# Patient Record
Sex: Male | Born: 1965 | Race: White | Hispanic: No | Smoking: Former smoker
Health system: Southern US, Community
[De-identification: ages and names within clinical notes are randomized; demographics above are authoritative.]

## PROBLEM LIST (undated history)

## (undated) HISTORY — PX: KNEE SURGERY: SHX244

## (undated) HISTORY — PX: ANTERIOR CRUCIATE LIGAMENT REPAIR: SHX115

## (undated) HISTORY — PX: APPENDECTOMY: SHX54

---

## 2000-12-02 ENCOUNTER — Ambulatory Visit (HOSPITAL_COMMUNITY): Admission: RE | Admit: 2000-12-02 | Discharge: 2000-12-02 | Payer: Self-pay | Admitting: *Deleted

## 2000-12-02 ENCOUNTER — Encounter: Payer: Self-pay | Admitting: *Deleted

## 2005-06-02 ENCOUNTER — Emergency Department (HOSPITAL_COMMUNITY): Admission: EM | Admit: 2005-06-02 | Discharge: 2005-06-02 | Payer: Self-pay | Admitting: Emergency Medicine

## 2005-12-14 ENCOUNTER — Inpatient Hospital Stay (HOSPITAL_COMMUNITY): Admission: EM | Admit: 2005-12-14 | Discharge: 2005-12-15 | Payer: Self-pay | Admitting: Emergency Medicine

## 2010-10-11 ENCOUNTER — Observation Stay (HOSPITAL_COMMUNITY)
Admission: EM | Admit: 2010-10-11 | Discharge: 2010-10-11 | Disposition: A | Discharge status: Home / Self Care | Payer: Worker's Compensation | Attending: Orthopedic Surgery | Admitting: Orthopedic Surgery

## 2010-10-11 DIAGNOSIS — W298XXA Contact with other powered powered hand tools and household machinery, initial encounter: Secondary | ICD-10-CM | POA: Insufficient documentation

## 2010-10-11 DIAGNOSIS — S62609B Fracture of unspecified phalanx of unspecified finger, initial encounter for open fracture: Principal | ICD-10-CM | POA: Insufficient documentation

## 2010-10-11 DIAGNOSIS — Y998 Other external cause status: Secondary | ICD-10-CM | POA: Insufficient documentation

## 2010-10-11 LAB — DIFFERENTIAL
Basophils Relative: 0 % (ref 0–1)
Eosinophils Absolute: 0.1 10*3/uL (ref 0.0–0.7)
Neutrophils Relative %: 58 % (ref 43–77)

## 2010-10-11 LAB — PROTIME-INR
INR: 0.92 (ref 0.00–1.49)
Prothrombin Time: 12.6 seconds (ref 11.6–15.2)

## 2010-10-11 LAB — POCT I-STAT, CHEM 8
BUN: 17 mg/dL (ref 6–23)
Chloride: 105 mEq/L (ref 96–112)
HCT: 46 % (ref 39.0–52.0)
Sodium: 140 mEq/L (ref 135–145)
TCO2: 25 mmol/L (ref 0–100)

## 2010-10-11 LAB — CBC
Platelets: 214 10*3/uL (ref 150–400)
RBC: 4.79 MIL/uL (ref 4.22–5.81)
RDW: 12.5 % (ref 11.5–15.5)
WBC: 8.5 10*3/uL (ref 4.0–10.5)

## 2010-11-20 NOTE — Consult Note (Signed)
  NAME:  [name], [name] NO.:  [id number]  MEDICAL RECORD NO.:  [id number]           PATIENT TYPE:  I  LOCATION:  2550                         FACILITY:  [facility]  PHYSICIAN:  Artist Pais. Lanyah Spengler, M.D.DATE OF BIRTH:  [birth year]  DATE OF CONSULTATION:  10/11/2010 DATE OF DISCHARGE:  10/11/2010                                CONSULTATION   PHYSICIAN REQUESTING CONSULTATION:  April Palumbo-Rasch, MD  REASON FOR CONSULTATION:  Marvin Wilson is a 45 year old right-hand dominant male presents today transferred from [facility], [address] with a table saw injury to his nondominant left hand involving the long and ring fingers.  He is 45, has an allergy to CODEINE and BEE STINGS.  He is otherwise fairly healthy.  No significant other past medical or surgical history who has had an appendectomy in the past as well as an ACL reconstruction in the past.  He does not smoke or drink excessively.  No other medical issues, and no other medications on a regular basis.  Examination reveals complex injuries to the long and ring finger with nail plate fractures, loss of osseous integrity of the distal phalanges of the long and ring finger, and large complicated dorsal wound.  X-rays show open wounds with loss of the half of the distal phalanx in the long and ring finger.  IMPRESSION:  A 45 year old male with complex open injuries to his nondominant left long and ring fingers.  We had thorough and frank discussion regarding treatment options.  I would recommend to take him to the operating room for thorough irrigation and debridement of this grossly contaminated wound with repair as necessary.  They understand we will do this as soon as possible here at [facility].     Marvin Wilson, M.D.     MAW/MEDQ  D:  10/11/2010  T:  10/12/2010  Job:  [id number]  Electronically Signed by Dairl Ponder M.D. on 11/20/2010 11:24:02 AM

## 2010-11-20 NOTE — Op Note (Signed)
  NAME:  TYNER, CODNER NO.:  [id number]  MEDICAL RECORD NO.:  [id number]           PATIENT TYPE:  I  LOCATION:  2550                         FACILITY:  [facility]  PHYSICIAN:  Artist Pais. Deborahann Poteat, M.D.DATE OF BIRTH:  09-29-1965  DATE OF PROCEDURE:  10/11/2010 DATE OF DISCHARGE:  10/11/2010                              OPERATIVE REPORT   PREOPERATIVE DIAGNOSIS:  Complex left long, left ring finger, open wound distally.  POSTOPERATIVE DIAGNOSIS:  Complex left long, left ring finger, open wound distally.  PROCEDURE:  Irrigation and debridement above, open treatment distal phalangeal fractures and volar advancement flap, left long, left ring finger.  SURGEON:  Artist Pais. Mina Marble, MD  ASSISTANT:  None.  ANESTHESIA:  General.  TOURNIQUET TIME:  Twenty-six minutes.  COMPLICATIONS:  None.  DRAINS:  None.  The patient was taken to the operating suite after induction of adequate general anesthesia, left upper extremity was prepped and draped in sterile fashion.  Esmarch was used to exsanguinate the limb.  Tourniquet was then inflated to 150 mmHg at this point in time.  The left long and left ring finger were placed dorsally.  There was complex injury involving loss of the majority of the sterile matrix and part of the germinal matrix.  Loss of the half of the distal phalangeal, both long and ring finger.  The fracture sites were irrigated and debrided of clot.  At this point in time after this was done, the volar skin was advanced dorsally and sewn to the remaining nail plate at the germinal sterile matrix intersection using 4-0 Vicryl Rapide.  This was then advanced for both the long and ring finger, thus covering the distal phalangeal bone and the edge of the remaining nail bed.  Intraoperative inspection revealed good contour at the tips, they were then anesthetized with 0.25% Marcaine locally and digital blocks for both the long and ring finger, dressed  with Xeroform, 4x4s, and Coban wrap.  The patient tolerated the procedure well and went to recovery room in stable fashion.     Artist Pais Mina Marble, M.D.     MAW/MEDQ  D:  10/11/2010  T:  10/12/2010  Job:  [id number]  Electronically Signed by Dairl Ponder M.D. on 11/20/2010 11:23:58 AM

## 2010-12-09 NOTE — Discharge Summary (Signed)
  NAME:  HYMEN, ARNETT NO.:  [id number]  MEDICAL RECORD NO.:  [id number]           PATIENT TYPE:  O  LOCATION:  2550                         FACILITY:  [facility]  PHYSICIAN:  Artist Pais. Ardyce Heyer, M.D.DATE OF BIRTH:  1966/02/07  DATE OF ADMISSION:  10/11/2010 DATE OF DISCHARGE:  10/11/2010                              DISCHARGE SUMMARY   PREOPERATIVE DIAGNOSIS:  Left long left ring finger table saw injuries.  PRINCIPAL PROCEDURES:  Incision and drainage above with repair if necessary.  DISCHARGE DIAGNOSIS:  Incision and drainage above with repair if necessary.  Discharged to home in stable.  Windsor Goeken is a gentleman who sustained a table saw injury to his left long and ring fingers and was admitted to the hospital after he underwent repairs of the left long, left ring finger open wound with full advancement flaps, left long left ring fingers.  He did well postoperatively.  He was discharged from emergency department.  Follow up in my office in a week.  He was given antibiotics and pain control.  PREOPERATIVE DIAGNOSIS AND PRINCIPAL DIAGNOSIS:  Left long left ring finger open wound.  POSTOPERATIVE DIAGNOSIS:  Left long left ring finger open wound.  Discharged to home in stable.     Artist Pais Mina Marble, M.D.     MAW/MEDQ  D:  12/05/2010  T:  12/06/2010  Job:  [id number]  Electronically Signed by Dairl Ponder M.D. on 12/08/2010 08:38:46 PM

## 2011-05-10 ENCOUNTER — Emergency Department (HOSPITAL_COMMUNITY)
Admission: EM | Admit: 2011-05-10 | Discharge: 2011-05-10 | Disposition: A | Discharge status: Home / Self Care | Payer: BC Managed Care – PPO | Attending: Emergency Medicine | Admitting: Emergency Medicine

## 2011-05-10 DIAGNOSIS — T6391XA Toxic effect of contact with unspecified venomous animal, accidental (unintentional), initial encounter: Secondary | ICD-10-CM | POA: Insufficient documentation

## 2011-05-10 DIAGNOSIS — R609 Edema, unspecified: Secondary | ICD-10-CM | POA: Insufficient documentation

## 2011-05-10 DIAGNOSIS — T63461A Toxic effect of venom of wasps, accidental (unintentional), initial encounter: Secondary | ICD-10-CM | POA: Insufficient documentation

## 2015-08-02 ENCOUNTER — Emergency Department (HOSPITAL_COMMUNITY): Payer: Worker's Compensation

## 2015-08-02 ENCOUNTER — Encounter (HOSPITAL_COMMUNITY): Payer: Self-pay | Admitting: Emergency Medicine

## 2015-08-02 ENCOUNTER — Emergency Department (HOSPITAL_COMMUNITY)
Admission: EM | Admit: 2015-08-02 | Discharge: 2015-08-02 | Disposition: A | Discharge status: Home / Self Care | Payer: Worker's Compensation | Attending: Emergency Medicine | Admitting: Emergency Medicine

## 2015-08-02 DIAGNOSIS — Z87891 Personal history of nicotine dependence: Secondary | ICD-10-CM | POA: Insufficient documentation

## 2015-08-02 DIAGNOSIS — S68125A Partial traumatic metacarpophalangeal amputation of left ring finger, initial encounter: Secondary | ICD-10-CM | POA: Insufficient documentation

## 2015-08-02 DIAGNOSIS — Y990 Civilian activity done for income or pay: Secondary | ICD-10-CM | POA: Insufficient documentation

## 2015-08-02 DIAGNOSIS — W319XXA Contact with unspecified machinery, initial encounter: Secondary | ICD-10-CM | POA: Insufficient documentation

## 2015-08-02 DIAGNOSIS — S68123A Partial traumatic metacarpophalangeal amputation of left middle finger, initial encounter: Secondary | ICD-10-CM | POA: Insufficient documentation

## 2015-08-02 DIAGNOSIS — Y9289 Other specified places as the place of occurrence of the external cause: Secondary | ICD-10-CM | POA: Insufficient documentation

## 2015-08-02 DIAGNOSIS — Y9389 Activity, other specified: Secondary | ICD-10-CM | POA: Insufficient documentation

## 2015-08-02 MED ORDER — OXYCODONE-ACETAMINOPHEN 5-325 MG PO TABS: 2.0000 | ORAL_TABLET | ORAL | Status: DC | PRN

## 2015-08-02 MED ORDER — CEPHALEXIN 250 MG PO CAPS
500.0000 mg | ORAL_CAPSULE | Freq: Once | ORAL | Status: AC
  Administered 2015-08-02: 500 mg via ORAL
  Filled 2015-08-02: qty 2

## 2015-08-02 MED ORDER — LIDOCAINE HCL (PF) 1 % IJ SOLN
5.0000 mL | Freq: Once | INTRAMUSCULAR | Status: DC
  Filled 2015-08-02: qty 5

## 2015-08-02 MED ORDER — LIDOCAINE HCL (PF) 1 % IJ SOLN
5.0000 mL | Freq: Once | INTRAMUSCULAR | Status: AC
  Administered 2015-08-02: 5 mL via INTRADERMAL
  Filled 2015-08-02: qty 5

## 2015-08-02 MED ORDER — CEPHALEXIN 500 MG PO CAPS: 500.0000 mg | ORAL_CAPSULE | Freq: Four times a day (QID) | ORAL | Status: DC

## 2015-08-02 NOTE — Discharge Instructions (Signed)
Traumatic Finger Amputation Follow-up with Dr. Mina MarbleWeingold in one week. Take antibiotics as prescribed. A traumatic finger amputation is when you lose part or all of a finger because of an accident or injury. The severity of this type of injury can vary widely. It can mean that just the tip of your finger gets ripped off (avulsion), or it can mean that you completely lose a finger (amputation). Traumatic finger amputation is a medical emergency. It requires immediate care to prevent further damage and to save the finger. CAUSES A traumatic finger amputation usually results from an accident that involves:  A car.  Power tools.  Factory work.  Farm or Risk managerlawn equipment. SYMPTOMS  Symptoms of a traumatic finger amputation include:  Bleeding.  Pain.  Damage to surrounding tissues, such as bones, muscles, tendons, and skin. Severe injuries can sometimes lead to shock, which is a life-threatening condition in which your body fails to get blood, oxygen, and nutrients to vital organs. Some common symptoms of shock include low blood pressure, sweating, weakness, and pale skin. DIAGNOSIS Your health care provider will do a physical exam and ask for details about how your injury occurred. The physical exam will help your health care provider to determine the severity of the injury and the best way to repair it. X-rays may be done to check for damage to the surrounding bones and tissues. TREATMENT Treatment depends on the type of injury that you have and how bad it is.  Your health care provider will clean the wound thoroughly and apply a medicine (anesthetic) to relieve pain.  If just the tip of your finger was removed, the wound will typically heal on its own with a protective bandage (dressing) and regular cleaning.  For more severe injuries, a portion of skin may need to be taken from another part of the body (graft) and attached to the wound site until the wound heals.  If a large portion of the  finger was amputated, it may be possible to reattach it surgically (replantation). HOME CARE INSTRUCTIONS  Take medicines only as directed by your health care provider.  If you were prescribed an antibiotic medicine, finish all of it even if you start to feel better.  There are many different ways to close and cover a wound, including stitches (sutures), skin glue, and adhesive strips. Follow your health care provider's instructions about:  Wound care.  Dressing changes and removal.  Wound closure removal.  Check your wound every day for signs of infection. Watch for:  Redness, swelling, or pain.  Fluid, blood, or pus.  Do exercises to strengthen your finger and hand as directed by your health care provider. SEEK MEDICAL CARE IF:  Your wound does not seem to be healing well.  You have redness, swelling, or pain at your wound site.  You have fluid, blood, or pus coming from your wound.  You have a fever.   This information is not intended to replace advice given to you by your health care provider. Make sure you discuss any questions you have with your health care provider.   Document Released: 10/20/2001 Document Revised: 09/01/2014 Document Reviewed: 04/26/2014 Elsevier Interactive Patient Education [employer]2016 [employer]vier Inc.

## 2015-08-02 NOTE — ED Notes (Signed)
Wound placed in saline/peroxide soak.
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
## 2015-08-02 NOTE — ED Notes (Signed)
Pt from work with c/o middle left finger partial amputation from a jointer machine.  Bleeding controlled.  Partial finger nail intact.  NAD, ambulatory, A&O.

## 2016-05-11 ENCOUNTER — Emergency Department (HOSPITAL_COMMUNITY)
Admission: EM | Admit: 2016-05-11 | Discharge: 2016-05-11 | Disposition: A | Discharge status: Home / Self Care | Payer: No Typology Code available for payment source | Attending: Emergency Medicine | Admitting: Emergency Medicine

## 2016-05-11 ENCOUNTER — Emergency Department (HOSPITAL_COMMUNITY): Payer: No Typology Code available for payment source

## 2016-05-11 ENCOUNTER — Encounter (HOSPITAL_COMMUNITY): Payer: Self-pay

## 2016-05-11 DIAGNOSIS — R072 Precordial pain: Secondary | ICD-10-CM | POA: Diagnosis not present

## 2016-05-11 DIAGNOSIS — Y92410 Unspecified street and highway as the place of occurrence of the external cause: Secondary | ICD-10-CM | POA: Insufficient documentation

## 2016-05-11 DIAGNOSIS — Y939 Activity, unspecified: Secondary | ICD-10-CM | POA: Insufficient documentation

## 2016-05-11 DIAGNOSIS — R079 Chest pain, unspecified: Secondary | ICD-10-CM

## 2016-05-11 DIAGNOSIS — S39012A Strain of muscle, fascia and tendon of lower back, initial encounter: Secondary | ICD-10-CM

## 2016-05-11 DIAGNOSIS — Y999 Unspecified external cause status: Secondary | ICD-10-CM | POA: Diagnosis not present

## 2016-05-11 DIAGNOSIS — Z87891 Personal history of nicotine dependence: Secondary | ICD-10-CM | POA: Insufficient documentation

## 2016-05-11 DIAGNOSIS — S3992XA Unspecified injury of lower back, initial encounter: Secondary | ICD-10-CM | POA: Diagnosis present

## 2016-05-11 LAB — I-STAT TROPONIN, ED: Troponin i, poc: 0 ng/mL (ref 0.00–0.08)

## 2016-05-11 MED ORDER — KETOROLAC TROMETHAMINE 60 MG/2ML IM SOLN
60.0000 mg | Freq: Once | INTRAMUSCULAR | Status: AC
  Administered 2016-05-11: 60 mg via INTRAMUSCULAR
  Filled 2016-05-11: qty 2

## 2016-05-11 MED ORDER — IBUPROFEN 800 MG PO TABS: 800.0000 mg | ORAL_TABLET | Freq: Three times a day (TID) | ORAL | 0 refills | Status: AC

## 2016-05-11 MED ORDER — CYCLOBENZAPRINE HCL 10 MG PO TABS: 10.0000 mg | ORAL_TABLET | Freq: Two times a day (BID) | ORAL | 0 refills | Status: AC | PRN
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
## 2016-05-11 NOTE — Discharge Instructions (Signed)
You had abnormal findings on your EKG today (heart electrical tracing) We have no previous tracings to compare this to, to know if this is an old or new finding Your cardiac blood test was negative However, you should be seen by a primary doctor as soon as possible to establish care and to get further cardiac workup Please return to the ED for any new chest pain that is worse or continuous
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
## 2016-05-11 NOTE — ED Notes (Signed)
Called pt to be re-vitalized and unable to locate pt.

## 2016-05-11 NOTE — ED Notes (Signed)
Pt refused to get lab work drawn.
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
## 2019-11-24 ENCOUNTER — Ambulatory Visit: Payer: Self-pay | Attending: Internal Medicine

## 2019-11-24 DIAGNOSIS — Z23 Encounter for immunization: Secondary | ICD-10-CM

## 2019-11-24 NOTE — Progress Notes (Signed)
   Covid-19 Vaccination Clinic  Name:  Marvin Wilson    MRN: [id number] DOB: 1965/10/07  11/24/2019  [name] was observed post Covid-19 immunization for 30 minutes based on pre-vaccination screening without incident. He was provided with Vaccine Information Sheet and instruction to access the V-Safe system.   [name] was instructed to call 911 with any severe reactions post vaccine: Marland Kitchen Difficulty breathing  . Swelling of face and throat  . A fast heartbeat  . A bad rash all over body  . Dizziness and weakness   Immunizations Administered    Name Date Dose VIS Date Route   Pfizer COVID-19 Vaccine 11/24/2019  8:40 AM 0.3 mL 08/05/2019 Intramuscular   Manufacturer: [employer], [employer]   Lot: [id number]   NDC: [address]-5

## 2019-12-20 ENCOUNTER — Ambulatory Visit: Payer: Self-pay | Attending: Internal Medicine

## 2019-12-20 DIAGNOSIS — Z23 Encounter for immunization: Secondary | ICD-10-CM

## 2019-12-20 NOTE — Progress Notes (Signed)
   Covid-19 Vaccination Clinic  Name:  Marvin Wilson    MRN: [id number] DOB: Jul 14, 1966  12/20/2019  Mr. Marvin Wilson was observed post Covid-19 immunization for 30 minutes based on pre-vaccination screening without incident. He was provided with Vaccine Information Sheet and instruction to access the V-Safe system.   Mr. Marvin Wilson was instructed to call 911 with any severe reactions post vaccine: Marland Kitchen Difficulty breathing  . Swelling of face and throat  . A fast heartbeat  . A bad rash all over body  . Dizziness and weakness   Immunizations Administered    Name Date Dose VIS Date Route   Pfizer COVID-19 Vaccine 12/20/2019  8:11 AM 0.3 mL 10/19/2018 Intramuscular   Manufacturer: [employer], [employer]   Lot: [id number]   NDC: [address]-1

## 2022-11-05 ENCOUNTER — Emergency Department (HOSPITAL_COMMUNITY): Payer: PRIVATE HEALTH INSURANCE

## 2022-11-05 ENCOUNTER — Ambulatory Visit
Admission: EM | Admit: 2022-11-05 | Discharge: 2022-11-05 | Disposition: A | Discharge status: Home / Self Care | Payer: PRIVATE HEALTH INSURANCE

## 2022-11-05 ENCOUNTER — Emergency Department (HOSPITAL_COMMUNITY)
Admission: EM | Admit: 2022-11-05 | Discharge: 2022-11-05 | Disposition: A | Discharge status: Home / Self Care | Payer: PRIVATE HEALTH INSURANCE | Attending: Emergency Medicine | Admitting: Emergency Medicine

## 2022-11-05 ENCOUNTER — Other Ambulatory Visit: Payer: Self-pay

## 2022-11-05 DIAGNOSIS — R410 Disorientation, unspecified: Secondary | ICD-10-CM | POA: Diagnosis not present

## 2022-11-05 DIAGNOSIS — R059 Cough, unspecified: Secondary | ICD-10-CM | POA: Diagnosis present

## 2022-11-05 DIAGNOSIS — Z20822 Contact with and (suspected) exposure to covid-19: Secondary | ICD-10-CM | POA: Diagnosis not present

## 2022-11-05 DIAGNOSIS — J101 Influenza due to other identified influenza virus with other respiratory manifestations: Secondary | ICD-10-CM | POA: Diagnosis not present

## 2022-11-05 DIAGNOSIS — B349 Viral infection, unspecified: Secondary | ICD-10-CM | POA: Diagnosis not present

## 2022-11-05 DIAGNOSIS — R197 Diarrhea, unspecified: Secondary | ICD-10-CM

## 2022-11-05 DIAGNOSIS — R0989 Other specified symptoms and signs involving the circulatory and respiratory systems: Secondary | ICD-10-CM | POA: Diagnosis not present

## 2022-11-05 LAB — BLOOD GAS, VENOUS
Acid-Base Excess: 2.5 mmol/L — ABNORMAL HIGH (ref 0.0–2.0)
Bicarbonate: 26.3 mmol/L (ref 20.0–28.0)
O2 Saturation: 72.6 %
Patient temperature: 37
pCO2, Ven: 37 mmHg — ABNORMAL LOW (ref 44–60)
pH, Ven: 7.46 — ABNORMAL HIGH (ref 7.25–7.43)
pO2, Ven: 37 mmHg (ref 32–45)

## 2022-11-05 LAB — AMMONIA: Ammonia: 18 umol/L (ref 9–35)

## 2022-11-05 LAB — CBC WITH DIFFERENTIAL/PLATELET
Abs Immature Granulocytes: 0.05 10*3/uL (ref 0.00–0.07)
Basophils Absolute: 0 10*3/uL (ref 0.0–0.1)
Basophils Relative: 0 %
Eosinophils Absolute: 0 10*3/uL (ref 0.0–0.5)
Eosinophils Relative: 0 %
HCT: 52 % (ref 39.0–52.0)
Hemoglobin: 17.4 g/dL — ABNORMAL HIGH (ref 13.0–17.0)
Immature Granulocytes: 1 %
Lymphocytes Relative: 11 %
Lymphs Abs: 1.2 10*3/uL (ref 0.7–4.0)
MCH: 30.7 pg (ref 26.0–34.0)
MCHC: 33.5 g/dL (ref 30.0–36.0)
MCV: 91.9 fL (ref 80.0–100.0)
Monocytes Absolute: 0.7 10*3/uL (ref 0.1–1.0)
Monocytes Relative: 7 %
Neutro Abs: 8.3 10*3/uL — ABNORMAL HIGH (ref 1.7–7.7)
Neutrophils Relative %: 81 %
Platelets: 154 10*3/uL (ref 150–400)
RBC: 5.66 MIL/uL (ref 4.22–5.81)
RDW: 12.6 % (ref 11.5–15.5)
WBC: 10.2 10*3/uL (ref 4.0–10.5)
nRBC: 0 % (ref 0.0–0.2)

## 2022-11-05 LAB — COMPREHENSIVE METABOLIC PANEL
ALT: 40 U/L (ref 0–44)
AST: 31 U/L (ref 15–41)
Albumin: 4.3 g/dL (ref 3.5–5.0)
Alkaline Phosphatase: 50 U/L (ref 38–126)
Anion gap: 9 (ref 5–15)
BUN: 17 mg/dL (ref 6–20)
CO2: 23 mmol/L (ref 22–32)
Calcium: 8.7 mg/dL — ABNORMAL LOW (ref 8.9–10.3)
Chloride: 100 mmol/L (ref 98–111)
Creatinine, Ser: 0.76 mg/dL (ref 0.61–1.24)
GFR, Estimated: >60 mL/min (ref >60)
Glucose, Bld: 113 mg/dL — ABNORMAL HIGH (ref 70–99)
Potassium: 3.8 mmol/L (ref 3.5–5.1)
Sodium: 132 mmol/L — ABNORMAL LOW (ref 135–145)
Total Bilirubin: 1 mg/dL (ref 0.3–1.2)
Total Protein: 8.4 g/dL — ABNORMAL HIGH (ref 6.5–8.1)

## 2022-11-05 LAB — RESP PANEL BY RT-PCR (RSV, FLU A&B, COVID)  RVPGX2
Influenza A by PCR: NEGATIVE
Influenza B by PCR: POSITIVE — AB
Resp Syncytial Virus by PCR: NEGATIVE
SARS Coronavirus 2 by RT PCR: NEGATIVE

## 2022-11-05 LAB — RAPID URINE DRUG SCREEN, HOSP PERFORMED
Amphetamines: NOT DETECTED
Barbiturates: NOT DETECTED
Benzodiazepines: NOT DETECTED
Cocaine: NOT DETECTED
Opiates: NOT DETECTED
Tetrahydrocannabinol: POSITIVE — AB

## 2022-11-05 LAB — ETHANOL: Alcohol, Ethyl (B): <10 mg/dL (ref <10)

## 2022-11-05 NOTE — ED Triage Notes (Signed)
Pt c/o cough, wheeze, body aches, diarrhea, "brain not working"   Onset ~ Friday
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
## 2022-11-05 NOTE — Discharge Instructions (Signed)
Please go straight to the emergency department as soon as you leave urgent care for further evaluation and management as I am very concerned with how you have been disoriented.

## 2022-11-05 NOTE — ED Provider Triage Note (Signed)
Emergency Medicine Provider Triage Evaluation Note  Marvin Wilson , a 57 y.o. male  was evaluated in triage.  Pt complains of coughing with confusion, generalized weakness.  Patient reports onset of symptoms 6 to 7 days ago with coughing which has been persistent through the weekend.  He reports that 2 days ago he was confused and tried to drive to work even though he did not have to work.  He says he fell like he was in a brain fog.  His head feels clearer today but he continues to have coughing.  He also reports some loose bowel movements.  He said he had 2 negative home COVID test.  He went to urgent care earlier today and was referred into the ED for further evaluation.  He denies history of COPD, asthma, smoking  Review of Systems  Positive: Cough, congestion, confusion Negative: Chest pain, leg swelling  Physical Exam  BP (!) 145/89 (BP Location: Left Arm)   Pulse 92   Temp 98.3 F (36.8 C) (Oral)   Resp 15   Wt 97 kg   SpO2 100%   BMI 31.58 kg/m  Gen:   Awake, no distress   Resp:  Normal effort; rhonchorous breath sounds bilaterally MSK:   Moves extremities without difficulty  Other:  No acute neurological deficits.    Medical Decision Making  Medically screening exam initiated at 9:32 AM.  Appropriate orders placed.  Marvin Wilson was informed that the remainder of the evaluation will be completed by another provider, this initial triage assessment does not replace that evaluation, and the importance of remaining in the ED until their evaluation is complete.  Patient undergoing workup for transient altered mental status as well as productive cough, including infectious workup, viral workup, electrolyte check.  I will also check ammonia, EtOH level, UDS.  No localizing strokelike symptoms.  His respiratory symptoms and diarrhea are not consistent with CNS lesion or stroke.   Wyvonnia Dusky, MD 11/05/22 [phone]

## 2022-11-05 NOTE — ED Triage Notes (Signed)
Sent from UC with c/o AMS stating "I got pulled over on way to work saying I was all over road and I don't recall being all over road, [place] [address] [place] [address] to go home and don't know why I did it, then when I got home instead of pulling in beside my truck I turned into it, I really don't know why I did it, its like my brain isn't working"  Also c/o cough, body aches, and diarrhea that started when waking up Friday am

## 2022-11-05 NOTE — ED Notes (Signed)
Patient is being discharged from the Urgent Care and sent to the Emergency Department via self . Per haley, patient is in need of higher level of care due to bodyaches. Patient is aware and verbalizes understanding of plan of care.  Vitals:   11/05/22 0819  BP: 135/89  Pulse: 100  Resp: 18  Temp: 98.3 F (36.8 C)  SpO2: 96%

## 2022-11-05 NOTE — ED Provider Notes (Signed)
[place] [facility] AT [facility] Provider Note   CSN: [id number] Arrival date & time: 11/05/22  [id number]     History  Chief Complaint  Patient presents with   Altered Mental Status   Diarrhea    Marvin Wilson is a 57 y.o. male presented to ED with transient confusion, coughing, diarrhea, poor appetite, for approximately 6 to 7 days.  He reports multiple coworkers were out sick with similar symptoms this week.  He had 2 negative home COVID test.  He was referred in from urgent care for further evaluation.  He says he feels "fine now".  HPI     Home Medications Prior to Admission medications   Medication Sig Start Date End Date Taking? Authorizing Provider  cyclobenzaprine (FLEXERIL) 10 MG tablet Take 1 tablet (10 mg total) by mouth 2 (two) times daily as needed for muscle spasms. 05/11/16   Ivin Booty, MD  ibuprofen (ADVIL,MOTRIN) 800 MG tablet Take 1 tablet (800 mg total) by mouth 3 (three) times daily. 05/11/16   Ivin Booty, MD      Allergies    Bee venom, Wasp venom, and Codeine    Review of Systems   Review of Systems  Physical Exam Updated Vital Signs BP (!) 145/89 (BP Location: Left Arm)   Pulse 92   Temp 98.3 F (36.8 C) (Oral)   Resp 15   Wt 97 kg   SpO2 100%   BMI 31.58 kg/m  Physical Exam Constitutional:      General: He is not in acute distress. HENT:     Head: Normocephalic and atraumatic.  Eyes:     Conjunctiva/sclera: Conjunctivae normal.     Pupils: Pupils are equal, round, and reactive to light.  Cardiovascular:     Rate and Rhythm: Normal rate and regular rhythm.  Pulmonary:     Effort: Pulmonary effort is normal. No respiratory distress.     Breath sounds: Normal breath sounds.     Comments: No hypoxia, rhonchorous breath sounds in the lower lobes Abdominal:     General: There is no distension.     Tenderness: There is no abdominal tenderness.  Skin:    General: Skin is warm and dry.  Neurological:     General:  No focal deficit present.     Mental Status: He is alert. Mental status is at baseline.  Psychiatric:        Mood and Affect: Mood normal.        Behavior: Behavior normal.     ED Results / Procedures / Treatments   Labs (all labs ordered are listed, but only abnormal results are displayed) Labs Reviewed  RESP PANEL BY RT-PCR (RSV, FLU A&B, COVID)  RVPGX2 - Abnormal; Notable for the following components:      Result Value   Influenza B by PCR POSITIVE (*)    All other components within normal limits  COMPREHENSIVE METABOLIC PANEL - Abnormal; Notable for the following components:   Sodium 132 (*)    Glucose, Bld 113 (*)    Calcium 8.7 (*)    Total Protein 8.4 (*)    All other components within normal limits  CBC WITH DIFFERENTIAL/PLATELET - Abnormal; Notable for the following components:   Hemoglobin 17.4 (*)    Neutro Abs 8.3 (*)    All other components within normal limits  BLOOD GAS, VENOUS - Abnormal; Notable for the following components:   pH, Ven 7.46 (*)    pCO2, Ven 37 (*)  Acid-Base Excess 2.5 (*)    All other components within normal limits  AMMONIA  ETHANOL  RAPID URINE DRUG SCREEN, HOSP PERFORMED    EKG None  Radiology DG Chest 2 View  Result Date: 11/05/2022 CLINICAL DATA:  shortness of breath, coughing EXAM: CHEST - 2 VIEW COMPARISON:  Chest x-ray 05/11/2016. FINDINGS: The heart size and mediastinal contours are within normal limits. Both lungs are clear. No visible pleural effusions or pneumothorax. No acute osseous abnormality. IMPRESSION: No active cardiopulmonary disease. Electronically Signed   By: Margaretha Sheffield M.D.   On: 11/05/2022 10:30    Procedures Procedures    Medications Ordered in ED Medications - No data to display  ED Course/ Medical Decision Making/ A&P Clinical Course as of 11/05/22 1142  Wed Nov 05, 2022  1136 Discussed the patient's results with him.  I suspect this is all related to influenza.  At this point there is no  indication for antibiotics or hospitalization.  He is breathing comfortably on room air.  Recommended continue quarantine to the 7-day mark and a work note was provided. [MT]    Clinical Course User Index [MT] Tycho Cheramie, Carola Rhine, MD                             Medical Decision Making Amount and/or Complexity of Data Reviewed Labs: ordered. Radiology: ordered.   This patient presents to the ED with concern for cough, congestion, transient confusion. This involves an extensive number of treatment options, and is a complaint that carries with it a high risk of complications and morbidity.  The differential diagnosis includes viral illness anemia versus electrolyte derangement versus pneumonia versus other  External records from outside source obtained and reviewed including care evaluation  I ordered and personally interpreted labs.  The pertinent results include: Influenza is positive.  No other emergent findings noted on blood test today.  I ordered imaging studies including x-ray of the chest I independently visualized and interpreted imaging which showed no focal infiltrate or abnormality I agree with the radiologist interpretation  I have reviewed the patients home medicines and have made adjustments as needed  Test Considered: Low suspicion for CNS lesion or CVA.  No indication for neuroimaging at this time  After the interventions noted above, I reevaluated the patient and found that they have: stayed the same  Dispostion:  After consideration of the diagnostic results and the patients response to treatment, I feel that the patent would benefit from conservative management at home, hydration, follow-up with his PCP.  We discussed quarantine measures.  He is stable for discharge         Final Clinical Impression(s) / ED Diagnoses Final diagnoses:  Influenza B    Rx / DC Orders ED Discharge Orders     None         Wyvonnia Dusky, MD 11/05/22 1142
